# Patient Record
Sex: Male | Born: 2000 | Race: Black or African American | Hispanic: No | Marital: Single | Smoking: Never smoker
Health system: Southern US, Community
[De-identification: ages and names within clinical notes are randomized; demographics above are authoritative.]

---
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
## 2021-08-24 IMAGING — DX DG CHEST 2V
2 series · 2 of 2 positions shown · non-contrast
Comparison: None.

CLINICAL DATA: Chest pain

EXAM:
CHEST - 2 VIEW

[chest pa]
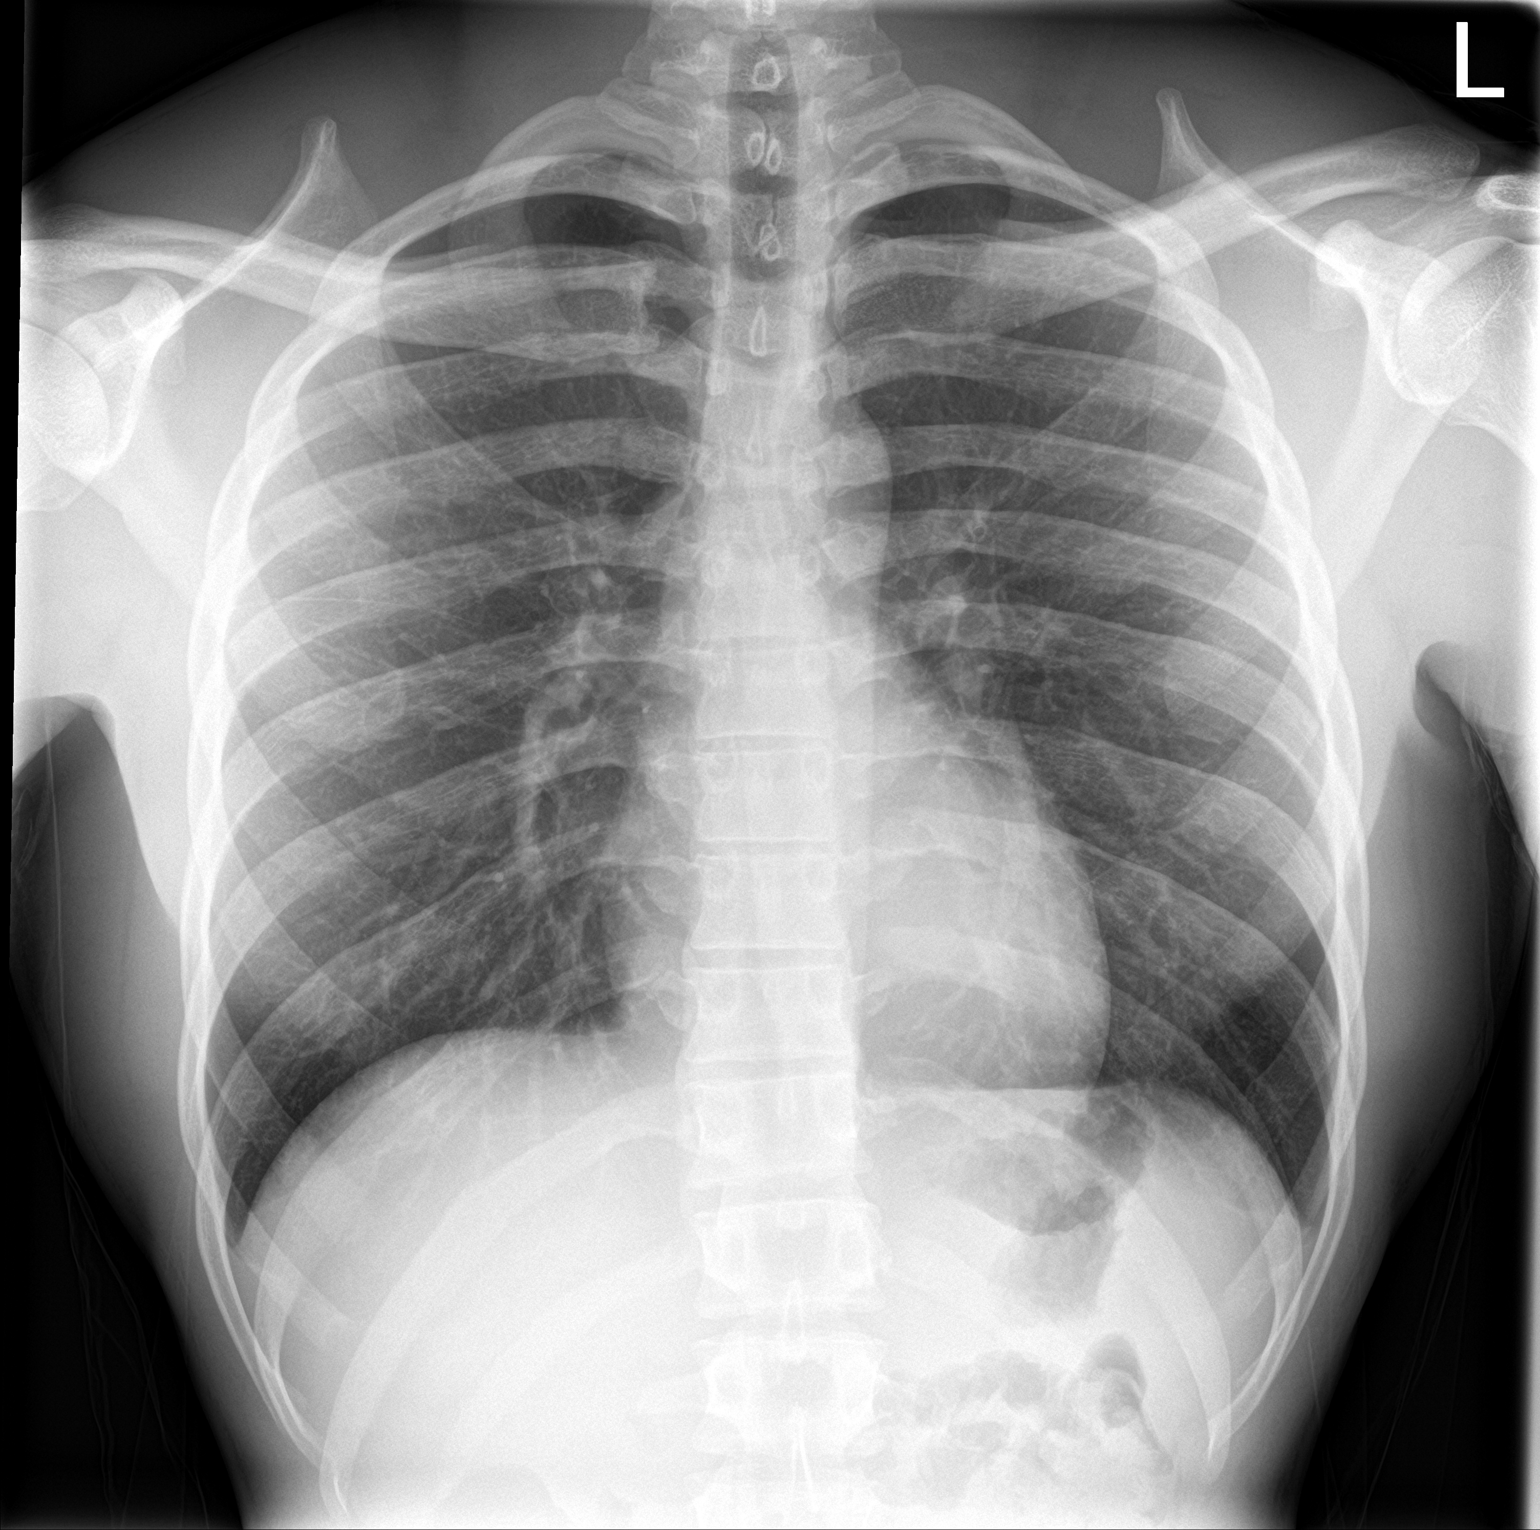

[chest lat]
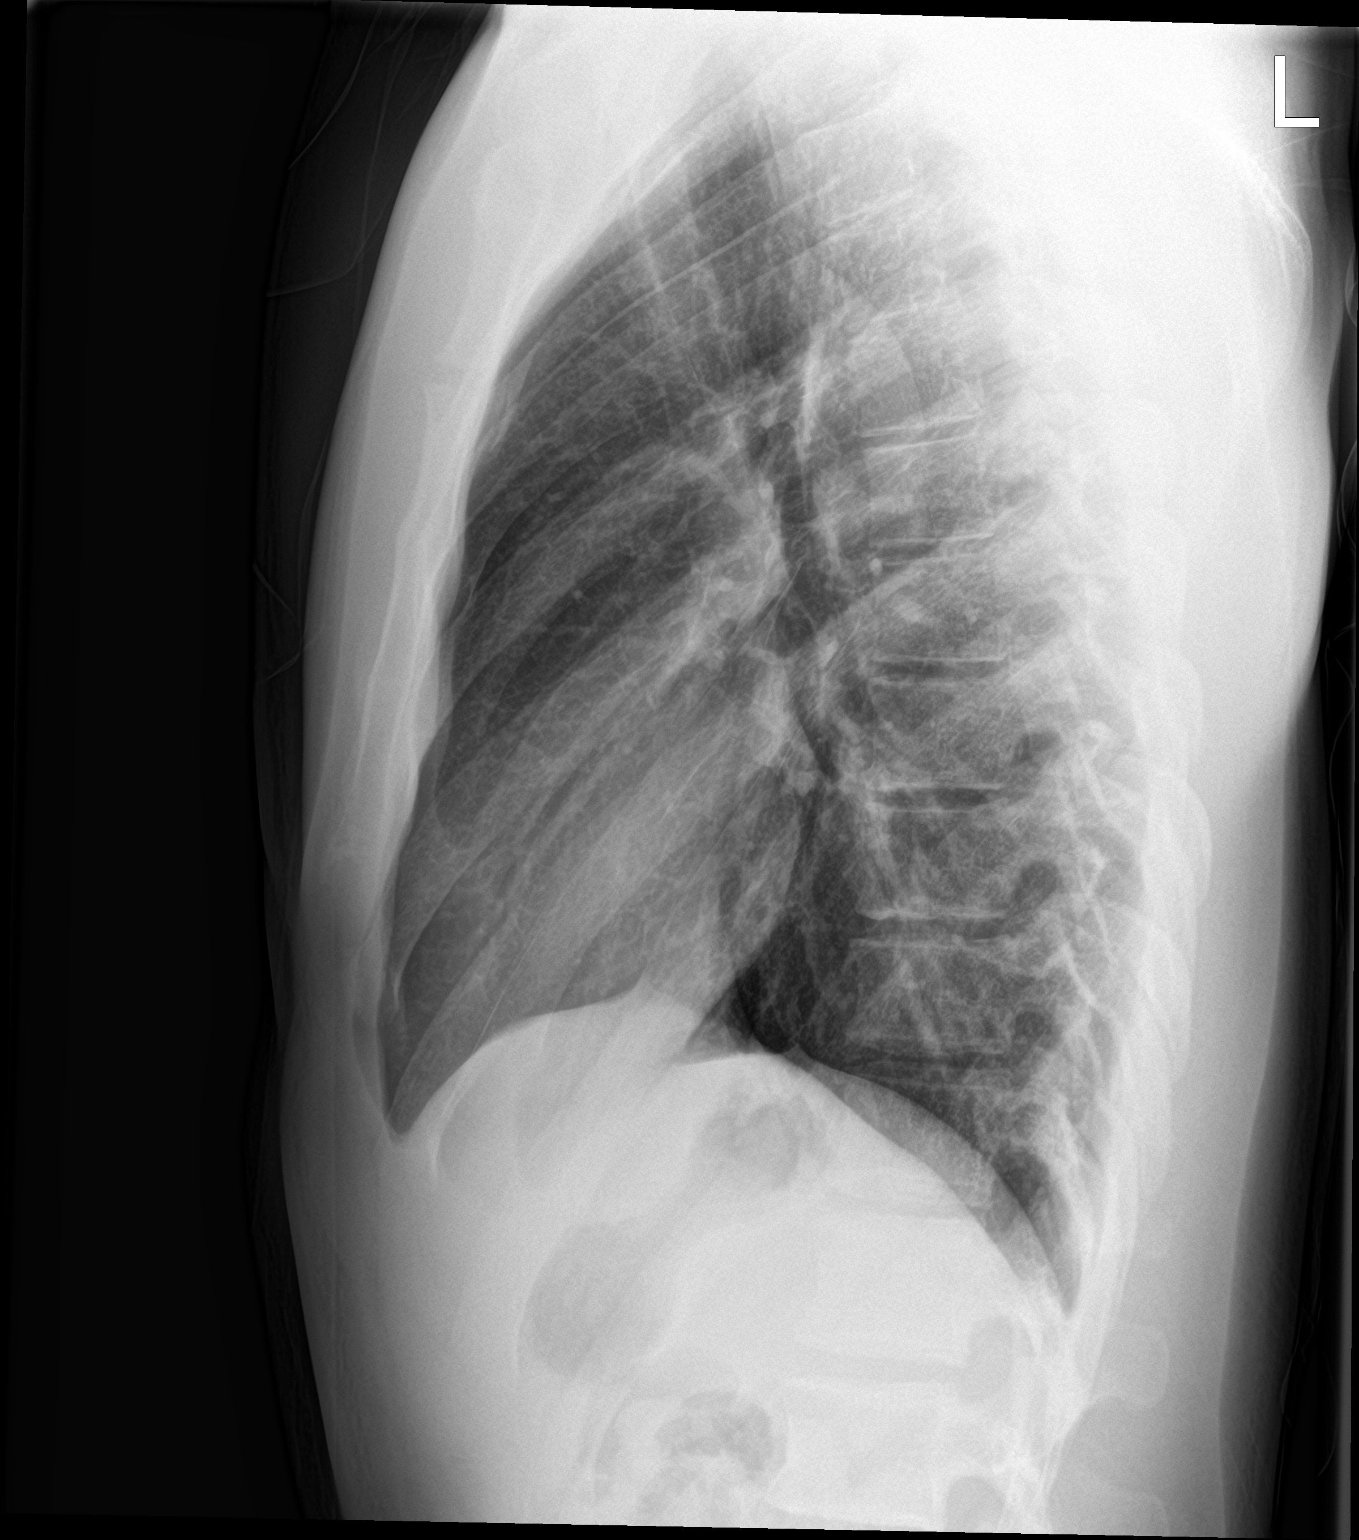

[2 of 2 positions shown; findings below may reference images not displayed]

FINDINGS: The heart size and mediastinal contours are within normal limits.
Both lungs are clear. The visualized skeletal structures are
unremarkable.
IMPRESSION: No active cardiopulmonary disease.

## 2021-09-08 ENCOUNTER — Encounter: Payer: Self-pay | Admitting: Emergency Medicine

## 2021-09-08 ENCOUNTER — Ambulatory Visit
Admission: EM | Admit: 2021-09-08 | Discharge: 2021-09-08 | Disposition: A | Discharge status: Home / Self Care | Payer: BLUE CROSS/BLUE SHIELD

## 2021-09-08 DIAGNOSIS — U071 COVID-19: Secondary | ICD-10-CM | POA: Diagnosis not present

## 2021-09-08 NOTE — ED Provider Notes (Signed)
[facility]    CSN: [id number] Arrival date & time: 09/08/21  1317      History   Chief Complaint Chief Complaint  Patient presents with   Letter for School/Work    HPI Seabron Durand Wittmeyer is a 21 y.o. male.   Patient presents today requesting note to return to school after testing positive for COVID-19 with an at home test.  Patient reports that symptoms started about 6 days prior.  He denies any current symptoms and states that all symptoms have resolved.  He recently had chills, generalized body aches, nasal congestion, sore throat.  Patient also states that he took a COVID test at home today that was negative since symptoms have resolved.     History reviewed. No pertinent past medical history.  There are no problems to display for this patient.   History reviewed. No pertinent surgical history.     Home Medications    Prior to Admission medications   Not on File    Family History History reviewed. No pertinent family history.  Social History Social History   Tobacco Use   Smoking status: Never   Smokeless tobacco: Never  Substance Use Topics   Alcohol use: Not Currently   Drug use: Not Currently     Allergies   Patient has no known allergies.   Review of Systems Review of Systems Per HPI  Physical Exam Triage Vital Signs ED Triage Vitals [09/08/21 1331]  Enc Vitals Group     BP 119/79     Pulse Rate 67     Resp 18     Temp 98.2 F (36.8 C)     Temp src      SpO2 98 %     Weight      Height      Head Circumference      Peak Flow      Pain Score 0     Pain Loc      Pain Edu?      Excl. in GC?    No data found.  Updated Vital Signs BP 119/79   Pulse 67   Temp 98.2 F (36.8 C)   Resp 18   SpO2 98%   Visual Acuity Right Eye Distance:   Left Eye Distance:   Bilateral Distance:    Right Eye Near:   Left Eye Near:    Bilateral Near:     Physical Exam Constitutional:      General: He is not in acute  distress.    Appearance: Normal appearance. He is not toxic-appearing or diaphoretic.  HENT:     Head: Normocephalic and atraumatic.     Right Ear: Tympanic membrane and ear canal normal.     Left Ear: Tympanic membrane and ear canal normal.     Nose: Nose normal.     Mouth/Throat:     Mouth: Mucous membranes are moist.     Pharynx: No posterior oropharyngeal erythema.  Eyes:     Extraocular Movements: Extraocular movements intact.     Conjunctiva/sclera: Conjunctivae normal.  Cardiovascular:     Rate and Rhythm: Normal rate and regular rhythm.     Pulses: Normal pulses.     Heart sounds: Normal heart sounds.  Pulmonary:     Effort: Pulmonary effort is normal. No respiratory distress.     Breath sounds: Normal breath sounds.  Neurological:     General: No focal deficit present.     Mental Status: He is alert  and oriented to person, place, and time. Mental status is at baseline.  Psychiatric:        Mood and Affect: Mood normal.        Behavior: Behavior normal.        Thought Content: Thought content normal.        Judgment: Judgment normal.      UC Treatments / Results  Labs (all labs ordered are listed, but only abnormal results are displayed) Labs Reviewed - No data to display  EKG   Radiology No results found.  Procedures Procedures (including critical care time)  Medications Ordered in UC Medications - No data to display  Initial Impression / Assessment and Plan / UC Course  I have reviewed the triage vital signs and the nursing notes.  Pertinent labs & imaging results that were available during my care of the patient were reviewed by me and considered in my medical decision making (see chart for details).     Patient tested positive for COVID-19 with an at home test about 6 days ago.  Symptoms started about 6 days ago.  Patient's physical exam is fairly benign and patient reports no current symptoms.  Patient offered repeat COVID testing but declined.   Given symptoms started 6 days ago and recommended quarantine is 5 days per CDC, I do think it would be reasonable for patient to return to school/band practice.  Although, advised patient to wear mask for an additional 5 days if around other people.  Patient verbalized understanding and was agreeable with plan. Final Clinical Impressions(s) / UC Diagnoses   Final diagnoses:  COVID-19   Discharge Instructions   None    ED Prescriptions   None    PDMP not reviewed this encounter.   Gustavus Bryant, [place] 09/08/21 1349

## 2021-09-08 NOTE — ED Triage Notes (Signed)
Pt states that he needs a note for his school so he can participate in band. Pt states that he tested negative for covid today and completed his five day isolation.
